# Patient Record
Sex: Female | Born: 1950 | Race: White | Hispanic: No | Marital: Married | Smoking: Never smoker
Health system: Southern US, Community
[De-identification: ages and names within clinical notes are randomized; demographics above are authoritative.]

## PROBLEM LIST (undated history)

## (undated) DIAGNOSIS — N200 Calculus of kidney: Secondary | ICD-10-CM

## (undated) HISTORY — PX: TONSILLECTOMY: SUR1361

## (undated) HISTORY — PX: ABDOMINAL HYSTERECTOMY: SHX81

---

## 1998-04-22 ENCOUNTER — Inpatient Hospital Stay (HOSPITAL_COMMUNITY): Admission: EM | Admit: 1998-04-22 | Discharge: 1998-04-23 | Payer: Self-pay | Admitting: Emergency Medicine

## 2001-12-09 ENCOUNTER — Other Ambulatory Visit: Admission: RE | Admit: 2001-12-09 | Discharge: 2001-12-09 | Payer: Self-pay | Admitting: *Deleted

## 2007-06-03 ENCOUNTER — Other Ambulatory Visit: Admission: RE | Admit: 2007-06-03 | Discharge: 2007-06-03 | Payer: Self-pay | Admitting: Gynecology

## 2009-12-21 ENCOUNTER — Encounter (INDEPENDENT_AMBULATORY_CARE_PROVIDER_SITE_OTHER): Payer: Self-pay | Admitting: Obstetrics and Gynecology

## 2009-12-21 ENCOUNTER — Ambulatory Visit (HOSPITAL_COMMUNITY): Admission: RE | Admit: 2009-12-21 | Discharge: 2009-12-22 | Payer: Self-pay | Admitting: Obstetrics and Gynecology

## 2011-01-31 LAB — CBC
Hemoglobin: 14.1 g/dL (ref 12.0–15.0)
MCHC: 33.4 g/dL (ref 30.0–36.0)
MCHC: 33.6 g/dL (ref 30.0–36.0)
Platelets: 223 10*3/uL (ref 150–400)
RBC: 3.25 MIL/uL — ABNORMAL LOW (ref 3.87–5.11)
RDW: 12.9 % (ref 11.5–15.5)
RDW: 13 % (ref 11.5–15.5)

## 2011-01-31 LAB — BASIC METABOLIC PANEL
CO2: 27 mEq/L (ref 19–32)
Calcium: 8.8 mg/dL (ref 8.4–10.5)
Creatinine, Ser: 0.81 mg/dL (ref 0.4–1.2)
GFR calc Af Amer: >60 mL/min (ref >60)

## 2013-01-13 ENCOUNTER — Emergency Department (HOSPITAL_COMMUNITY)
Admission: EM | Admit: 2013-01-13 | Discharge: 2013-01-14 | Disposition: A | Discharge status: Home / Self Care | Payer: BC Managed Care – PPO | Attending: Emergency Medicine | Admitting: Emergency Medicine

## 2013-01-13 ENCOUNTER — Emergency Department (HOSPITAL_COMMUNITY): Payer: BC Managed Care – PPO

## 2013-01-13 ENCOUNTER — Encounter (HOSPITAL_COMMUNITY): Payer: Self-pay

## 2013-01-13 DIAGNOSIS — R109 Unspecified abdominal pain: Secondary | ICD-10-CM | POA: Insufficient documentation

## 2013-01-13 DIAGNOSIS — R079 Chest pain, unspecified: Secondary | ICD-10-CM | POA: Insufficient documentation

## 2013-01-13 DIAGNOSIS — Z87442 Personal history of urinary calculi: Secondary | ICD-10-CM | POA: Insufficient documentation

## 2013-01-13 DIAGNOSIS — R0609 Other forms of dyspnea: Secondary | ICD-10-CM | POA: Insufficient documentation

## 2013-01-13 DIAGNOSIS — R110 Nausea: Secondary | ICD-10-CM | POA: Insufficient documentation

## 2013-01-13 DIAGNOSIS — R0600 Dyspnea, unspecified: Secondary | ICD-10-CM

## 2013-01-13 DIAGNOSIS — M549 Dorsalgia, unspecified: Secondary | ICD-10-CM | POA: Insufficient documentation

## 2013-01-13 DIAGNOSIS — R0989 Other specified symptoms and signs involving the circulatory and respiratory systems: Secondary | ICD-10-CM | POA: Insufficient documentation

## 2013-01-13 DIAGNOSIS — Z90710 Acquired absence of both cervix and uterus: Secondary | ICD-10-CM | POA: Insufficient documentation

## 2013-01-13 HISTORY — DX: Calculus of kidney: N20.0

## 2013-01-13 LAB — CBC WITH DIFFERENTIAL/PLATELET
Basophils Absolute: 0 10*3/uL (ref 0.0–0.1)
Basophils Relative: 0 % (ref 0–1)
Eosinophils Relative: 1 % (ref 0–5)
HCT: 42.4 % (ref 36.0–46.0)
MCH: 31.1 pg (ref 26.0–34.0)
MCHC: 34.4 g/dL (ref 30.0–36.0)
MCV: 90.2 fL (ref 78.0–100.0)
Monocytes Absolute: 0.3 10*3/uL (ref 0.1–1.0)
RDW: 12.9 % (ref 11.5–15.5)

## 2013-01-13 LAB — COMPREHENSIVE METABOLIC PANEL
ALT: 17 U/L (ref 0–35)
Albumin: 3.6 g/dL (ref 3.5–5.2)
Alkaline Phosphatase: 43 U/L (ref 39–117)
Glucose, Bld: 143 mg/dL — ABNORMAL HIGH (ref 70–99)
Potassium: 3.9 mEq/L (ref 3.5–5.1)
Sodium: 138 mEq/L (ref 135–145)
Total Protein: 7.5 g/dL (ref 6.0–8.3)

## 2013-01-13 LAB — POCT I-STAT TROPONIN I

## 2013-01-13 MED ORDER — ONDANSETRON HCL 4 MG/2ML IJ SOLN
4.0000 mg | Freq: Once | INTRAMUSCULAR | Status: AC
  Administered 2013-01-13: 4 mg via INTRAVENOUS
  Filled 2013-01-13: qty 2

## 2013-01-13 MED ORDER — MORPHINE SULFATE 2 MG/ML IJ SOLN
2.0000 mg | Freq: Once | INTRAMUSCULAR | Status: AC
  Administered 2013-01-13: 2 mg via INTRAVENOUS
  Filled 2013-01-13: qty 1

## 2013-01-13 MED ORDER — ASPIRIN 325 MG PO TABS
325.0000 mg | ORAL_TABLET | Freq: Once | ORAL | Status: AC
  Administered 2013-01-13: 325 mg via ORAL
  Filled 2013-01-13: qty 1

## 2013-01-13 NOTE — ED Provider Notes (Signed)
History     CSN: [id number]  Arrival date & time 01/13/13  2102   First MD Initiated Contact with Patient 01/13/13 2118      Chief Complaint  Patient presents with  . Shortness of Breath     Patient is a 62 y.o. female presenting with shortness of breath. The history is provided by the patient.  Shortness of Breath Severity:  Moderate Onset quality:  Gradual Progression:  Improving Chronicity:  New Relieved by:  Nothing Worsened by:  Nothing tried Associated symptoms: no cough   pt is here for multiple complaints She reports she has had nausea for 3 days but no vomiting She also reports and episode of CP yesterday that started in chest and radiated into neck - this lasted for 30 minutes.  She has not had any symptoms of CP since then She also reports SOB today but this is improving.  No associated cough is reported She also reports flank and back pain.    She denies h/o CAD/CVA.  No h/o PE/DVT No recent travel/surgery.  She is not on estrogen products Past Medical History  Diagnosis Date  . Kidney calculi     Past Surgical History  Procedure Laterality Date  . Abdominal hysterectomy    . Tonsillectomy      No family history on file.  History  Substance Use Topics  . Smoking status: Not on file  . Smokeless tobacco: Not on file  . Alcohol Use: 3.6 oz/week    6 Glasses of wine per week    OB History   Grav Para Term Preterm Abortions TAB SAB Ect Mult Living                  Review of Systems  Respiratory: Positive for shortness of breath. Negative for cough.   All other systems reviewed and are negative.    Allergies  Review of patient's allergies indicates no known allergies.  Home Medications  No current outpatient prescriptions on file.  BP 142/89  Pulse 86  Temp(Src) 98.9 F (37.2 C) (Oral)  Resp 18  SpO2 99%  Physical Exam CONSTITUTIONAL: Well developed/well nourished HEAD: Normocephalic/atraumatic EYES: EOMI/PERRL, no icterus ENMT:  Mucous membranes moist NECK: supple no meningeal signs SPINE:entire spine nontender CV: S1/S2 noted, no murmurs/rubs/gallops noted LUNGS: Lungs are clear to auscultation bilaterally, no apparent distress ABDOMEN: soft, mild tenderness to RUQ, no rebound or guarding GU:no cva tenderness NEURO: Pt is awake/alert, moves all extremitiesx4, no focal motor weakness is noted EXTREMITIES: pulses normal, full ROM SKIN: warm, color normal PSYCH: no abnormalities of mood noted  ED Course  Procedures   11:04 PM Pt with SOB this evening with unclear etiology.  Clinically stable at this time.  She also has RUQ tenderness.  Will start with labs and reassess.   12:52 AM Pt reports she woke up with SOB, but denies any anxiety.  She has no active CP but did have episode yesterday.  No h/o chronic lung disease.  Her abdominal exam on repeat exam is unremarkable.  I doubt acute abdominal process.   She reports her SOB is improving.  Will send d-dimer to r/o PE.  If ddimer and repeat troponin negative, will be safe for d/c home  MDM  Nursing notes including past medical history and social history reviewed and considered in documentation xrays reviewed and considered Labs/vital reviewed and considered        Date: 01/13/2013  Rate: 84  Rhythm: normal sinus rhythm  QRS  Axis: normal  Intervals: normal  ST/T Wave abnormalities: nonspecific ST changes  Conduction Disutrbances:none  Narrative Interpretation:   Old EKG Reviewed: none available at time of interpretation    Joya Gaskins, MD 01/14/13 [phone]

## 2013-01-13 NOTE — ED Notes (Addendum)
Pt to ed with c/o Shortness of breath.  States started 30 mins pta of EMS. feeling like she was breathing too fast.. No hx of this before.  Has felt nauseated throughout this week. EMS gave zofran 4 ivp.  States that the shortness of breath has decreased now.

## 2013-01-14 ENCOUNTER — Encounter (HOSPITAL_COMMUNITY): Payer: Self-pay | Admitting: Radiology

## 2013-01-14 ENCOUNTER — Emergency Department (HOSPITAL_COMMUNITY): Payer: BC Managed Care – PPO

## 2013-01-14 DIAGNOSIS — R0609 Other forms of dyspnea: Secondary | ICD-10-CM

## 2013-01-14 DIAGNOSIS — R0989 Other specified symptoms and signs involving the circulatory and respiratory systems: Secondary | ICD-10-CM

## 2013-01-14 LAB — D-DIMER, QUANTITATIVE: D-Dimer, Quant: 1.27 ug/mL-FEU — ABNORMAL HIGH (ref 0.00–0.48)

## 2013-01-14 LAB — URINE MICROSCOPIC-ADD ON

## 2013-01-14 LAB — URINALYSIS, ROUTINE W REFLEX MICROSCOPIC
Hgb urine dipstick: NEGATIVE
Protein, ur: NEGATIVE mg/dL
Urobilinogen, UA: 1 mg/dL (ref 0.0–1.0)

## 2013-01-14 LAB — POCT I-STAT TROPONIN I: Troponin i, poc: 0 ng/mL (ref 0.00–0.08)

## 2013-01-14 MED ORDER — ONDANSETRON HCL 4 MG PO TABS: 4.0000 mg | ORAL_TABLET | Freq: Four times a day (QID) | ORAL | Status: AC

## 2013-01-14 MED ORDER — ONDANSETRON 4 MG PO TBDP
8.0000 mg | ORAL_TABLET | Freq: Once | ORAL | Status: AC
  Administered 2013-01-14: 8 mg via ORAL
  Filled 2013-01-14: qty 2

## 2013-01-14 MED ORDER — IOHEXOL 350 MG/ML SOLN
100.0000 mL | Freq: Once | INTRAVENOUS | Status: AC | PRN
  Administered 2013-01-14: 100 mL via INTRAVENOUS

## 2013-01-14 NOTE — ED Notes (Signed)
Pt dc to home.  Pt states understanding to dc instructions.  Pt ambulatory to exit. Denies need for w/c 

## 2013-01-14 NOTE — ED Provider Notes (Signed)
Results for orders placed during the hospital encounter of 01/13/13  COMPREHENSIVE METABOLIC PANEL      Result Value Range   Sodium 138  135 - 145 mEq/L   Potassium 3.9  3.5 - 5.1 mEq/L   Chloride 104  96 - 112 mEq/L   CO2 25  19 - 32 mEq/L   Glucose, Bld 143 (*) 70 - 99 mg/dL   BUN 15  6 - 23 mg/dL   Creatinine, Ser 4.54  0.50 - 1.10 mg/dL   Calcium 9.3  8.4 - 09.8 mg/dL   Total Protein 7.5  6.0 - 8.3 g/dL   Albumin 3.6  3.5 - 5.2 g/dL   AST 26  0 - 37 U/L   ALT 17  0 - 35 U/L   Alkaline Phosphatase 43  39 - 117 U/L   Total Bilirubin 0.2 (*) 0.3 - 1.2 mg/dL   GFR calc non Af Amer >90  >90 mL/min   GFR calc Af Amer >90  >90 mL/min  CBC WITH DIFFERENTIAL      Result Value Range   WBC 4.8  4.0 - 10.5 K/uL   RBC 4.70  3.87 - 5.11 MIL/uL   Hemoglobin 14.6  12.0 - 15.0 g/dL   HCT 11.9  14.7 - 82.9 %   MCV 90.2  78.0 - 100.0 fL   MCH 31.1  26.0 - 34.0 pg   MCHC 34.4  30.0 - 36.0 g/dL   RDW 56.2  13.0 - 86.5 %   Platelets 189  150 - 400 K/uL   Neutrophils Relative 79 (*) 43 - 77 %   Neutro Abs 3.8  1.7 - 7.7 K/uL   Lymphocytes Relative 14  12 - 46 %   Lymphs Abs 0.7  0.7 - 4.0 K/uL   Monocytes Relative 6  3 - 12 %   Monocytes Absolute 0.3  0.1 - 1.0 K/uL   Eosinophils Relative 1  0 - 5 %   Eosinophils Absolute 0.0  0.0 - 0.7 K/uL   Basophils Relative 0  0 - 1 %   Basophils Absolute 0.0  0.0 - 0.1 K/uL  LIPASE, BLOOD      Result Value Range   Lipase 24  11 - 59 U/L  URINALYSIS, ROUTINE W REFLEX MICROSCOPIC      Result Value Range   Color, Urine YELLOW  YELLOW   APPearance CLEAR  CLEAR   Specific Gravity, Urine 1.028  1.005 - 1.030   pH 7.5  5.0 - 8.0   Glucose, UA NEGATIVE  NEGATIVE mg/dL   Hgb urine dipstick NEGATIVE  NEGATIVE   Bilirubin Urine SMALL (*) NEGATIVE   Ketones, ur 40 (*) NEGATIVE mg/dL   Protein, ur NEGATIVE  NEGATIVE mg/dL   Urobilinogen, UA 1.0  0.0 - 1.0 mg/dL   Nitrite NEGATIVE  NEGATIVE   Leukocytes, UA SMALL (*) NEGATIVE  URINE MICROSCOPIC-ADD ON       Result Value Range   Squamous Epithelial / LPF FEW (*) RARE   WBC, UA 3-6  <3 WBC/hpf   Bacteria, UA RARE  RARE  D-DIMER, QUANTITATIVE      Result Value Range   D-Dimer, Quant 1.27 (*) 0.00 - 0.48 ug/mL-FEU  POCT I-STAT TROPONIN I      Result Value Range   Troponin i, poc 0.01  0.00 - 0.08 ng/mL   Comment 3           POCT I-STAT TROPONIN I      Result  Value Range   Troponin i, poc 0.00  0.00 - 0.08 ng/mL   Comment 3            Dg Chest 2 View  01/13/2013  *RADIOLOGY REPORT*  Clinical Data: Short of breath  CHEST - 2 VIEW  Comparison: None.  Findings: Normal mediastinum and cardiac silhouette.  Normal pulmonary  vasculature.  No evidence of effusion, infiltrate, or pneumothorax.  No acute bony abnormality.  IMPRESSION: No acute cardiopulmonary process.   Original Report Authenticated By: Genevive Bi, M.D.    Ct Angio Chest Pe W/cm &/or Wo Cm  01/14/2013  *RADIOLOGY REPORT*  Clinical Data: Shortness of breath; elevated D-dimer.  Nausea.  CT ANGIOGRAPHY CHEST  Technique:  Multidetector CT imaging of the chest using the standard protocol during bolus administration of intravenous contrast. Multiplanar reconstructed images including MIPs were obtained and reviewed to evaluate the vascular anatomy.  Contrast: OMNIPAQUE IOHEXOL 350 MG/ML SOLN  Comparison: Chest radiograph performed 01/13/2013  Findings: There is no evidence of pulmonary embolus.  The lungs are clear bilaterally.  There is no evidence of significant focal consolidation, pleural effusion or pneumothorax. No masses are identified; no abnormal focal contrast enhancement is seen.  The mediastinum is unremarkable in appearance.  No mediastinal lymphadenopathy is seen.  No pericardial effusion is identified. The great vessels are grossly unremarkable in appearance.  No axillary lymphadenopathy is seen.  The visualized portions of the thyroid gland are unremarkable in appearance.  The visualized portions of the liver and spleen  are unremarkable.  No acute osseous abnormalities are seen.  Mild degenerative change is noted at the lower cervical spine.  IMPRESSION:  1.  No evidence of pulmonary embolus. 2.  Lungs clear bilaterally.   Original Report Authenticated By: Tonia Ghent, M.D.     4:35 AM CT and labs reviewed - PT with some nausea no pain or SOB. VS WNL. Plan outpatient follow up stress test and f/u PCP for further evaluation, no ABd tenderness and neg Murphys sign.   Melinda Nielsen, MD 01/14/13 [phone]

## 2013-01-14 NOTE — ED Notes (Signed)
Pt returned from ct

## 2013-01-15 LAB — URINE CULTURE

## 2013-01-16 ENCOUNTER — Telehealth (HOSPITAL_COMMUNITY): Payer: Self-pay | Admitting: Emergency Medicine

## 2013-01-16 NOTE — ED Notes (Signed)
Patient has +Urine culture. Checking to see if treated appropriately. °

## 2013-01-16 NOTE — ED Notes (Signed)
+   Urine Chart sent to EDP office for review. 

## 2013-01-18 NOTE — ED Notes (Signed)
No further action needed per Dr Kohut 

## 2013-01-21 ENCOUNTER — Other Ambulatory Visit (HOSPITAL_COMMUNITY): Payer: Self-pay | Admitting: Family Medicine

## 2013-01-21 DIAGNOSIS — R569 Unspecified convulsions: Secondary | ICD-10-CM

## 2013-01-26 ENCOUNTER — Encounter: Payer: Self-pay | Admitting: Cardiology

## 2013-01-26 NOTE — Progress Notes (Signed)
Patient ID: Melinda Vance, female   DOB: 1951/02/05, 62 y.o.   MRN: [id number]   Melinda, Vance    Date of visit:  01/26/2013 DOB:  1951-06-22    Age:  62 yrs. Medical record number:  [id number]     Account number:  [id number] Primary Care Provider: Windle Guard ____________________________ CURRENT DIAGNOSES  1. Dyspnea  2. Hyperlipidemia ____________________________ ALLERGIES  NKDA ____________________________ MEDICATIONS  1. multivitamin tablet, 1 p.o. daily ____________________________ CHIEF COMPLAINTS  Stomach virus  in bed trouble breathing  To er ____________________________ HISTORY OF PRESENT ILLNESS  This very nice 62 year old female is seen at the request of Dr. Jeannetta Nap. She has previously been in good health without significant medical issues. She recently had a three-day history of a stomach buyer S. with diarrhea and some nausea and some mild vomiting. At the end of the 3 days she awoke and was significantly short of breath and went to the emergency room at [facility]. While there she had a mild elevated d-dimer and had a CT angiogram of the chest that did not show a pulmonary embolus. No coronary calcification was noted. Since then she has improved and his not having any dyspnea at the present time. She does have a significant family history of cardiac disease with her father having had a myocardial infarction in his 55s and dying. She normally does not have exertional chest discomfort and does not have PND, orthopnea or edema. She had a normal heart size on chest x-ray. ____________________________ PAST HISTORY  Past Medical Illnesses:  denies hypertension or diabetes;  Cardiovascular Illnesses:  no previous history of cardiac disease.;  Surgical Procedures:  cesarean section, hysterectomy, tonsillectomy;  Cardiology Procedures-Invasive:  no history of prior cardiac procedures;  Cardiology Procedures-Noninvasive:  no previous non-invasive procedures;  LVEF not  documented ____________________________ CARDIO-PULMONARY TEST DATES EKG Date:  01/26/2013;  Chest Xray Date: 01/14/2013;  CT Scan Date:  01/14/2013   ____________________________ FAMILY HISTORY Father - age 29,  died of heart disease; Mother - age 33,  alive and well, chf and Crohns disease; Brother 1 - age 36,  alive and well and  hypertension; Sister 1 - age 32,  alive and well and  diabetes-unknown type;  ____________________________ SOCIAL HISTORY Alcohol Use:  occasionally;  Smoking:  never smoked;  Diet:  regular diet;  Lifestyle:  married, 2 sons and 1 daughter;  Exercise:  no regular exercise;  Occupation:  Therapist, sports;  Residence:  lives with husband;   ____________________________ REVIEW OF SYSTEMS General:  denies recent weight change, fatique or change in exercise tolerance.Integumentary:  no rashes or new skin lesions.  Eyes:  denies diplopia, history of glaucoma or visual problems.  Ears, Nose, Throat, Mouth:  denies any hearing loss, epistaxis, hoarseness or difficulty   speaking.  Respiratory:  denies dyspnea, cough, wheezing or hemoptysis.  Cardiovascular:  please review HPI  Abdominal:  denies dyspepsia, GI bleeding, constipation, or diarrhea  Genitourinary-Female:  no dysuria, urgency, frequency, UTIs, or stress incontinence Musculoskeletal:  arthritis of the right knee  Neurological:  denies headaches, stroke, or TIA  Psychiatric:  job stress ____________________________ PHYSICAL EXAMINATION VITAL SIGNS  Blood Pressure:  124/80 Sitting, Left arm, regular cuff  , 132/82 Standing, Left arm and regular cuff   Pulse:  76/min. Weight:  152.00 lbs. Height:  64"BMI: 26  Constitutional:  pleasant white female, in no acute distress Skin:  warm and dry to touch, no apparent skin lesions, or masses noted. Head:  normocephalic, normal hair  pattern, no masses or tenderness Eyes:  EOMS Intact, PERRLA, C and S clear, Funduscopic exam not done. ENT:  ears, nose and throat reveal no  gross abnormalities.  Dentition good. Neck:  supple, without massess. No JVD, thyromegaly or carotid bruits. Carotid upstroke normal. Chest:  normal symmetry, clear to auscultation and percussion. Cardiac:  regular rhythm, normal S1 and S2, No S3 or S4, no murmurs, gallops or rubs detected. Abdomen:  abdomen soft,non-tender, no masses, no hepatospenomegaly, or aneurysm noted Peripheral Pulses:  the femoral,dorsalis pedis, and posterior tibial pulses are full and equal bilaterally with no bruits auscultated. Extremities & Back:  no deformities, clubbing, cyanosis, erythema or edema observed. Normal muscle strength and tone. Neurological:  no gross motor or sensory deficits noted, affect appropriate, oriented x3. ____________________________ MOST RECENT LIPID PANEL 01/21/13  CHOL TOTL 229 mg/dl, LDL 478 calc, HDL 54 mg/dl, TRIGLYCER 295 mg/dl and CHOL/HDL 4.2 (Calc) ____________________________ IMPRESSIONS/PLAN 1. Dyspnea occurring at rest in a patient following an acute viral illness who  is currently asymptomatic 2. Significant family history of heart disease 3. Mild hyperlipidemia  Recommendations:  Her exam was normal and her EKG is normal at this time. I would suggest an exercise treadmill test because of the recent dyspnea in light of her family history of heart disease. Follow up afterwards. ____________________________ Christianne Dolin  1. 12 Lead EKG: Today  2. treadmill:  Regular TM At [facility] [employer]                       ____________________________ Cardiology Physician:  Darden Palmer MD [facility]

## 2013-02-03 ENCOUNTER — Ambulatory Visit (HOSPITAL_COMMUNITY)
Admission: RE | Admit: 2013-02-03 | Discharge: 2013-02-03 | Disposition: A | Discharge status: Home / Self Care | Payer: BC Managed Care – PPO | Source: Ambulatory Visit | Attending: Family Medicine | Admitting: Family Medicine

## 2013-02-03 DIAGNOSIS — R569 Unspecified convulsions: Secondary | ICD-10-CM | POA: Insufficient documentation

## 2013-02-03 NOTE — Progress Notes (Signed)
Adult sleep deprived EEG completed.

## 2013-02-19 NOTE — Procedures (Signed)
EEG report.  Brief clinical history:  62 years old female with spells concerning for seizures.  Technique: this is a 17 channel routine scalp EEG performed at the bedside with bipolar and monopolar montages arranged in accordance to the international 10/20 system of electrode placement. One channel was dedicated to EKG recording.  The study was performed during wakefulness and drowsiness. Intermittent photic stimulation was the sole activating procedure utilized during the test. Description:In the wakeful state, the best background consisted of a medium amplitude, posterior dominant, well sustained, symmetric and reactive 11 Hz rhythm. Drowsiness demonstrated dropout of the alpha rhythm. Stage 2 sleep was not achieved. No focal or generalized epileptiform discharges noted.  No focal or generalized slowing seen.  Intermittent photic stimulation did induce a normal driving response. EKG showed sinus rhythm.  Impression: this is a normal awake and drowsy EEG. Please, be aware that a normal EEG does not exclude the possibility of epilepsy.  Clinical correlation is advised.  Wyatt Portela, MD

## 2014-11-10 IMAGING — CT CT ANGIO CHEST
1 of 3 series · 19 of 32 positions shown · IV contrast (APPLIED)
Comparison: Chest radiograph performed 01/13/2013

CLINICAL DATA: Shortness of breath; elevated D-dimer.  Nausea.

CT ANGIOGRAPHY CHEST
TECHNIQUE: Multidetector CT imaging of the chest using the
standard protocol during bolus administration of intravenous
contrast. Multiplanar reconstructed images including MIPs were
obtained and reviewed to evaluate the vascular anatomy.
Contrast: 100mL OMNIPAQUE IOHEXOL 350 MG/ML SOLN

[Series 6: pulm embolism 1.0 b25f st · axial · 0.60mm/px · z∈[-272,-44]mm · 19 of 254 slices shown]
[im 13/254  lung]
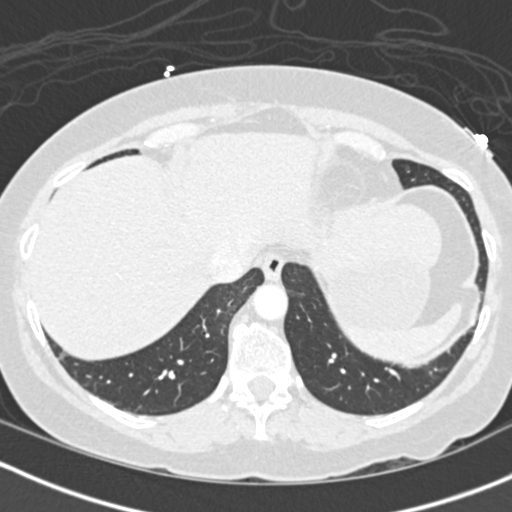
[im 26/254  mediastinal]
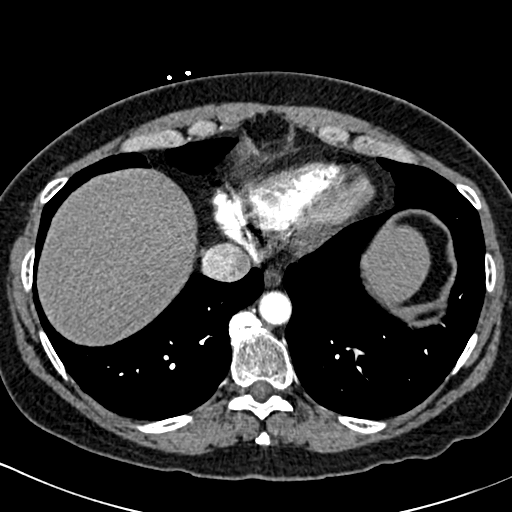
[im 38/254  lung]
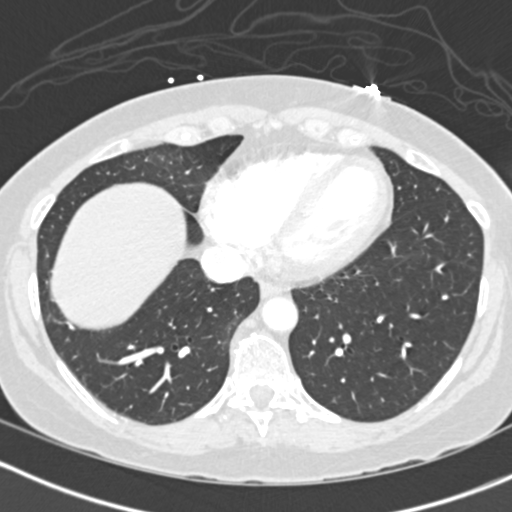
[im 64/254  mediastinal]
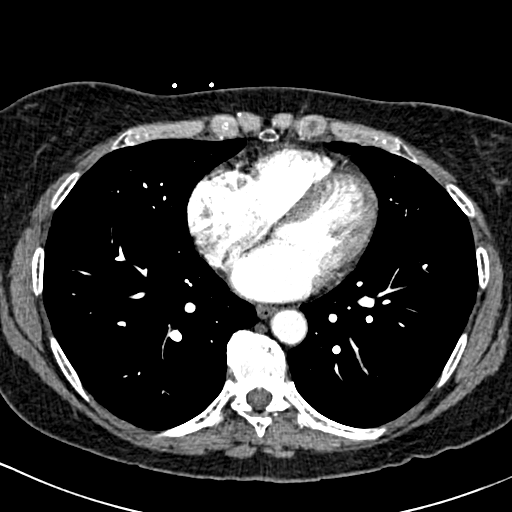
[im 76/254  lung]
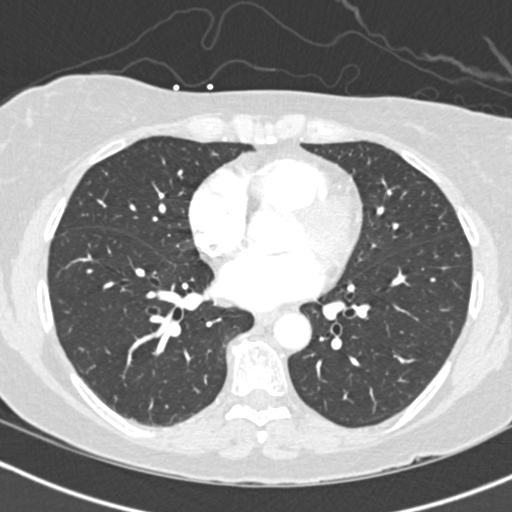
[im 85/254  mediastinal]
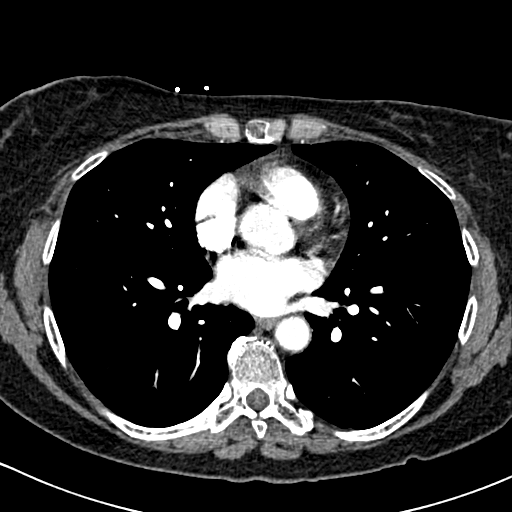
[im 89/254  lung]
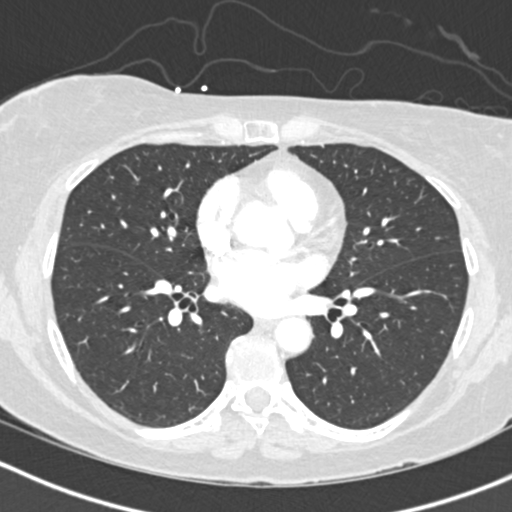
[im 102/254  mediastinal]
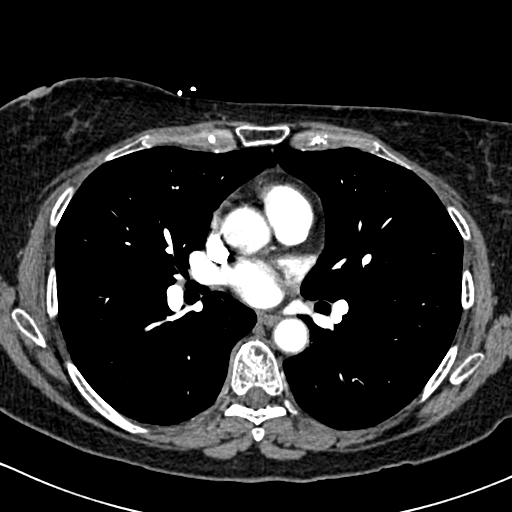
[im 114/254  lung]
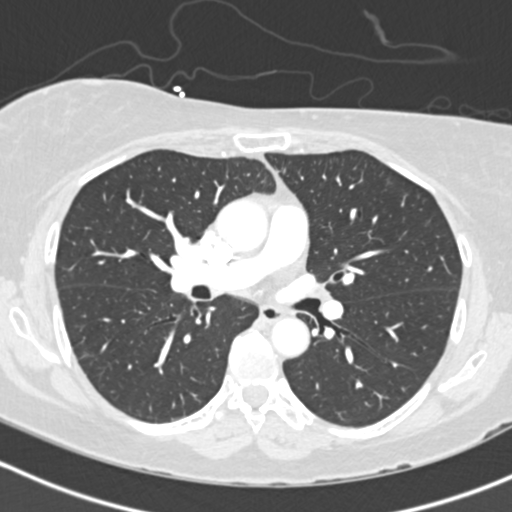
[im 127/254  mediastinal]
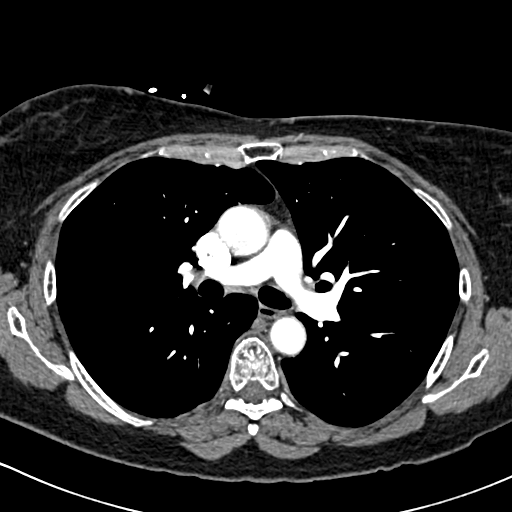
[im 140/254  lung]
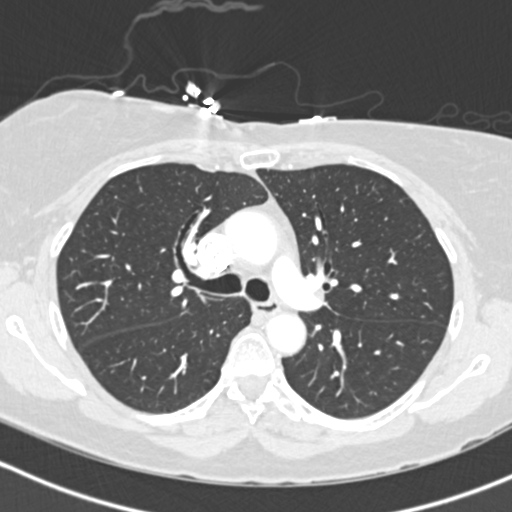
[im 152/254  mediastinal]
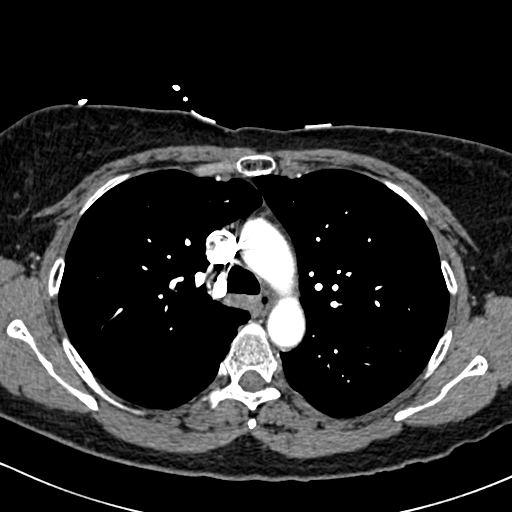
[im 165/254  lung]
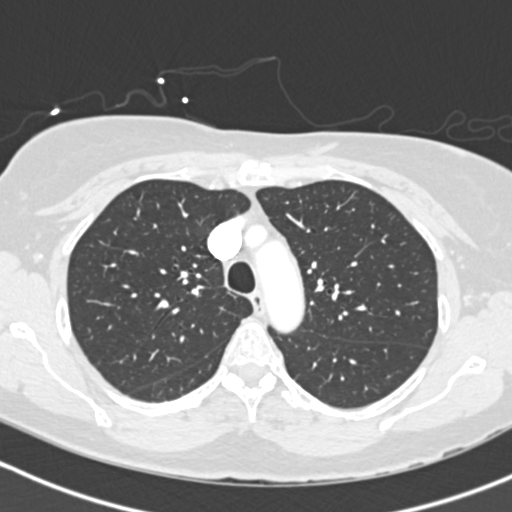
[im 169/254  mediastinal]
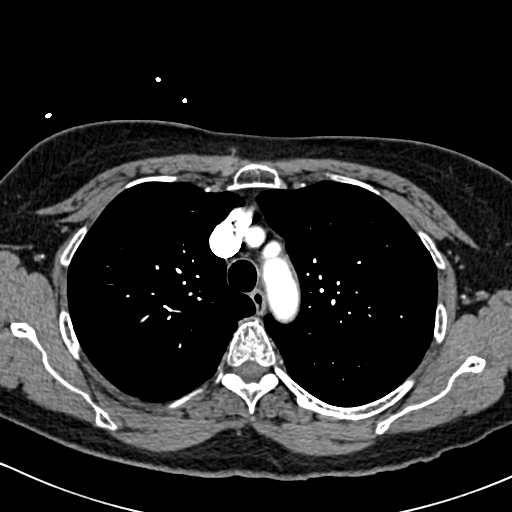
[im 178/254  lung]
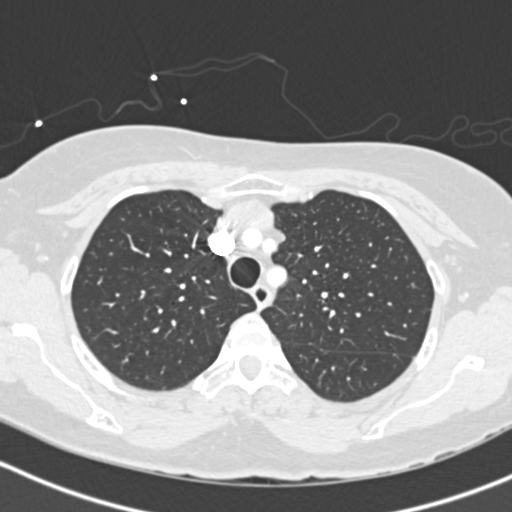
[im 190/254  mediastinal]
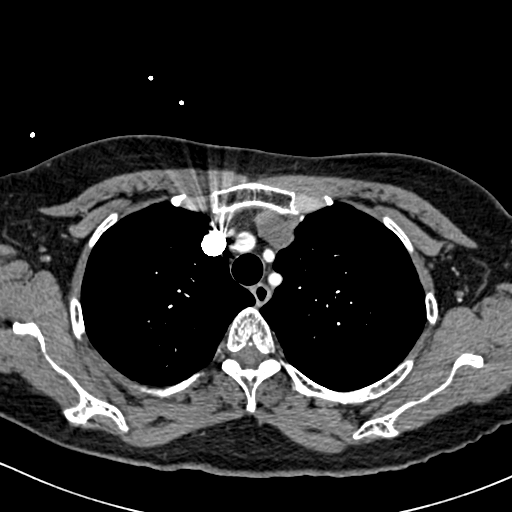
[im 216/254  lung]
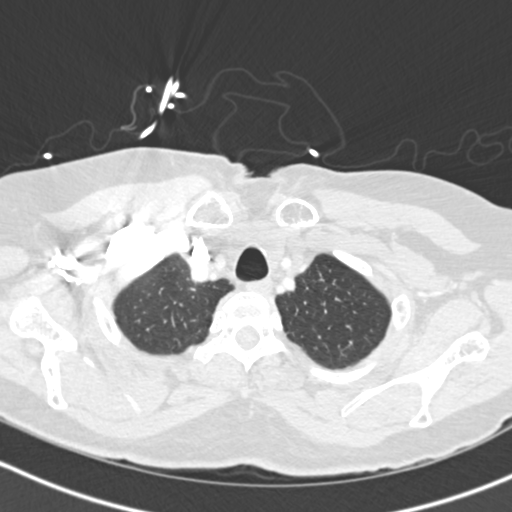
[im 228/254  mediastinal]
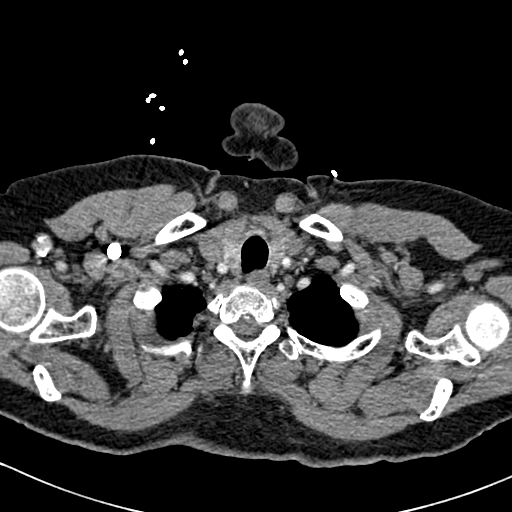
[im 241/254  lung]
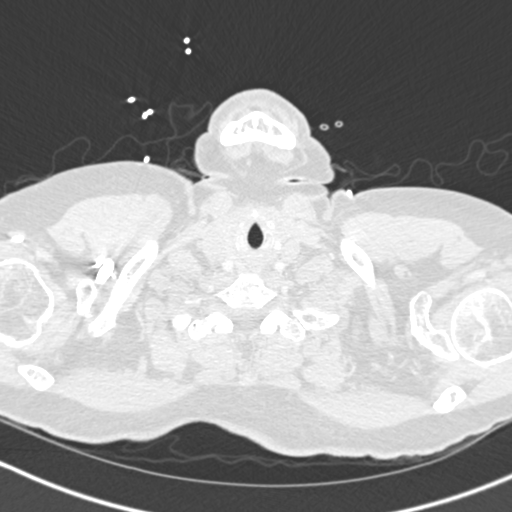

[19 of 32 positions shown; findings below may reference images not displayed]

FINDINGS: There is no evidence of pulmonary embolus.

The lungs are clear bilaterally.  There is no evidence of
significant focal consolidation, pleural effusion or pneumothorax.
No masses are identified; no abnormal focal contrast enhancement is
seen.

The mediastinum is unremarkable in appearance.  No mediastinal
lymphadenopathy is seen.  No pericardial effusion is identified.
The great vessels are grossly unremarkable in appearance.  No
axillary lymphadenopathy is seen.  The visualized portions of the
thyroid gland are unremarkable in appearance.

The visualized portions of the liver and spleen are unremarkable.

No acute osseous abnormalities are seen.  Mild degenerative change
is noted at the lower cervical spine.
IMPRESSION: 1.  No evidence of pulmonary embolus.
2.  Lungs clear bilaterally.

## 2015-05-01 ENCOUNTER — Emergency Department (HOSPITAL_COMMUNITY)
Admission: EM | Admit: 2015-05-01 | Discharge: 2015-05-02 | Disposition: A | Discharge status: Home / Self Care | Payer: No Typology Code available for payment source | Attending: Emergency Medicine | Admitting: Emergency Medicine

## 2015-05-01 ENCOUNTER — Encounter (HOSPITAL_COMMUNITY): Payer: Self-pay | Admitting: Emergency Medicine

## 2015-05-01 DIAGNOSIS — Y92410 Unspecified street and highway as the place of occurrence of the external cause: Secondary | ICD-10-CM | POA: Diagnosis not present

## 2015-05-01 DIAGNOSIS — Z79899 Other long term (current) drug therapy: Secondary | ICD-10-CM | POA: Insufficient documentation

## 2015-05-01 DIAGNOSIS — R519 Headache, unspecified: Secondary | ICD-10-CM

## 2015-05-01 DIAGNOSIS — S0083XA Contusion of other part of head, initial encounter: Secondary | ICD-10-CM | POA: Diagnosis not present

## 2015-05-01 DIAGNOSIS — Z87442 Personal history of urinary calculi: Secondary | ICD-10-CM | POA: Diagnosis not present

## 2015-05-01 DIAGNOSIS — S199XXA Unspecified injury of neck, initial encounter: Secondary | ICD-10-CM | POA: Insufficient documentation

## 2015-05-01 DIAGNOSIS — Z7982 Long term (current) use of aspirin: Secondary | ICD-10-CM | POA: Insufficient documentation

## 2015-05-01 DIAGNOSIS — Y9389 Activity, other specified: Secondary | ICD-10-CM | POA: Insufficient documentation

## 2015-05-01 DIAGNOSIS — Y998 Other external cause status: Secondary | ICD-10-CM | POA: Diagnosis not present

## 2015-05-01 DIAGNOSIS — M542 Cervicalgia: Secondary | ICD-10-CM

## 2015-05-01 DIAGNOSIS — R51 Headache: Secondary | ICD-10-CM
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
## 2015-05-02 MED ORDER — DIAZEPAM 5 MG PO TABS: 5.0000 mg | ORAL_TABLET | Freq: Three times a day (TID) | ORAL | Status: AC | PRN

## 2015-05-02 MED ORDER — HYDROCODONE-ACETAMINOPHEN 5-325 MG PO TABS
1.0000 | ORAL_TABLET | Freq: Once | ORAL | Status: AC
  Administered 2015-05-02: 1 via ORAL
  Filled 2015-05-02: qty 1

## 2015-05-02 MED ORDER — HYDROCODONE-ACETAMINOPHEN 5-325 MG PO TABS: 1.0000 | ORAL_TABLET | ORAL | Status: AC | PRN
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
## 2018-04-13 ENCOUNTER — Other Ambulatory Visit: Payer: Self-pay | Admitting: Nurse Practitioner

## 2018-04-16 ENCOUNTER — Other Ambulatory Visit: Payer: Self-pay | Admitting: Nurse Practitioner

## 2018-04-16 DIAGNOSIS — M79604 Pain in right leg: Secondary | ICD-10-CM

## 2018-04-16 DIAGNOSIS — M79605 Pain in left leg: Principal | ICD-10-CM

## 2018-04-27 ENCOUNTER — Ambulatory Visit
Admission: RE | Admit: 2018-04-27 | Discharge: 2018-04-27 | Disposition: A | Discharge status: Home / Self Care | Payer: Managed Care, Other (non HMO) | Source: Ambulatory Visit | Attending: Nurse Practitioner | Admitting: Nurse Practitioner

## 2018-04-27 DIAGNOSIS — M79605 Pain in left leg: Principal | ICD-10-CM

## 2018-04-27 DIAGNOSIS — M79604 Pain in right leg: Secondary | ICD-10-CM

## 2020-10-31 ENCOUNTER — Ambulatory Visit: Payer: Managed Care, Other (non HMO)

## 2020-11-07 ENCOUNTER — Ambulatory Visit: Payer: Managed Care, Other (non HMO)

## 2020-11-14 ENCOUNTER — Ambulatory Visit: Payer: Managed Care, Other (non HMO)

## 2020-12-12 ENCOUNTER — Ambulatory Visit: Payer: Managed Care, Other (non HMO)

## 2020-12-19 ENCOUNTER — Ambulatory Visit: Payer: Managed Care, Other (non HMO)

## 2020-12-26 ENCOUNTER — Ambulatory Visit: Payer: Managed Care, Other (non HMO)

## 2021-01-22 ENCOUNTER — Ambulatory Visit: Payer: Managed Care, Other (non HMO) | Admitting: Dietician

## 2021-01-29 ENCOUNTER — Ambulatory Visit: Payer: Managed Care, Other (non HMO) | Admitting: Dietician
